# Patient Record
Sex: Male | Born: 1995 | Race: White | Hispanic: No | Marital: Single | Smoking: Former smoker
Health system: Southern US, Community
[De-identification: ages and names within clinical notes are randomized; demographics above are authoritative.]

## PROBLEM LIST (undated history)

## (undated) DIAGNOSIS — E559 Vitamin D deficiency, unspecified: Secondary | ICD-10-CM

## (undated) HISTORY — DX: Vitamin D deficiency, unspecified: E55.9

## (undated) HISTORY — PX: WISDOM TOOTH EXTRACTION: SHX21

---
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
## 2017-05-01 DIAGNOSIS — R109 Unspecified abdominal pain: Secondary | ICD-10-CM | POA: Insufficient documentation

## 2017-12-13 ENCOUNTER — Emergency Department
Admission: EM | Admit: 2017-12-13 | Discharge: 2017-12-13 | Disposition: A | Discharge status: Home / Self Care | Payer: Self-pay | Attending: Emergency Medicine | Admitting: Emergency Medicine

## 2017-12-13 ENCOUNTER — Other Ambulatory Visit: Payer: Self-pay

## 2017-12-13 ENCOUNTER — Encounter: Payer: Self-pay | Admitting: Emergency Medicine

## 2017-12-13 DIAGNOSIS — F17200 Nicotine dependence, unspecified, uncomplicated: Secondary | ICD-10-CM | POA: Insufficient documentation

## 2017-12-13 DIAGNOSIS — T50901A Poisoning by unspecified drugs, medicaments and biological substances, accidental (unintentional), initial encounter: Secondary | ICD-10-CM

## 2017-12-13 DIAGNOSIS — F1910 Other psychoactive substance abuse, uncomplicated: Secondary | ICD-10-CM

## 2017-12-13 DIAGNOSIS — F1990 Other psychoactive substance use, unspecified, uncomplicated: Secondary | ICD-10-CM | POA: Insufficient documentation

## 2017-12-13 LAB — URINE DRUG SCREEN, QUALITATIVE (ARMC ONLY)
Amphetamines, Ur Screen: NOT DETECTED
Benzodiazepine, Ur Scrn: NOT DETECTED
COCAINE METABOLITE, UR ~~LOC~~: POSITIVE — AB
Cannabinoid 50 Ng, Ur ~~LOC~~: POSITIVE — AB
MDMA (Ecstasy)Ur Screen: NOT DETECTED
METHADONE SCREEN, URINE: NOT DETECTED
Opiate, Ur Screen: NOT DETECTED
Phencyclidine (PCP) Ur S: NOT DETECTED
TRICYCLIC, UR SCREEN: NOT DETECTED

## 2017-12-13 NOTE — ED Provider Notes (Signed)
[facility] Emergency Department Provider Note  ____________________________________________   First MD Initiated Contact with Patient 12/13/17 1830     (approximate)  I have reviewed the triage vital signs and the nursing notes.   HISTORY  Chief Complaint Drug Overdose   Level 5 caveat:  history/ROS limited by acute intoxication   HPI HA PLACERES is a 22 y.o. male with no chronic medical issues other than polysubstance abuse.  He presents by EMS for evaluation of a bad reaction to hallucinogenics and other recreational drugs.  He was with a friend who is also a patient of mine.  They were out in the woods and dropping acid as well as apparently using other drugs which most likely includes cocaine and marijuana.  They both had a bad trip and both felt like they were going to die.  They were able to call the other patient's mother who then called this patient's father and other people who were able to get them out of the woods and get them to help.  The patient remains paranoid and anxious but denies SI and HI.  He states that he knows what he did was wrong.  He initially had some shortness of breath but now he is only anxious and paranoid and wants to go smoke.  The onset was acute and his symptoms are severe but he is getting better over time.  Nothing in particular makes the symptoms better or worse.  History reviewed. No pertinent past medical history.  There are no active problems to display for this patient.   History reviewed. No pertinent surgical history.  Prior to Admission medications   Not on File    Allergies Patient has no known allergies.  History reviewed. No pertinent family history.  Social History Social History   Tobacco Use  . Smoking status: Current Every Day Smoker  . Smokeless tobacco: Never Used  Substance Use Topics  . Alcohol use: Not on file  . Drug use: Yes    Types: Cocaine, Marijuana    Comment: acid     Review of Systems Level 5 caveat:  history/ROS limited by acute intoxication  ____________________________________________   PHYSICAL EXAM:  VITAL SIGNS: ED Triage Vitals  Enc Vitals Group     BP 12/13/17 1757 138/63     Pulse Rate 12/13/17 1757 (!) 103     Resp 12/13/17 1839 18     Temp 12/13/17 1757 98.4 F (36.9 C)     Temp Source 12/13/17 1757 Oral     SpO2 12/13/17 1757 98 %     Weight 12/13/17 1758 61.2 kg (135 lb)     Height 12/13/17 1758 1.727 m (5\' 8" )     Head Circumference --      Peak Flow --      Pain Score 12/13/17 1758 0     Pain Loc --      Pain Edu? --      Excl. in GC? --     Constitutional: Alert and oriented.  Paranoid and anxious but in no acute distress Eyes: Conjunctivae are normal.  Head: Atraumatic. Nose: No congestion/rhinnorhea. Mouth/Throat: Mucous membranes are moist. Neck: No stridor.  No meningeal signs.   Cardiovascular: Mild tachycardia, regular rhythm. Good peripheral circulation. Grossly normal heart sounds. Respiratory: Normal respiratory effort.  No retractions. Lungs CTAB. Gastrointestinal: Soft and nontender. No distention.  Musculoskeletal: No lower extremity tenderness nor edema. No gross deformities of extremities. Neurologic:  Normal speech and language.  No gross focal neurologic deficits are appreciated.  Skin:  Skin is warm, dry and intact. No rash noted. Psychiatric: Mood and affect are anxious and paranoid but he denies SI and HI.  He states he knows what he did was wrong.  He states he knows he should stop using drugs but cannot make himself say that he will stop using.  ____________________________________________   LABS (all labs ordered are listed, but only abnormal results are displayed)  Labs Reviewed  URINE DRUG SCREEN, QUALITATIVE ([facility] ONLY) - Abnormal; Notable for the following components:      Result Value   Cocaine Metabolite,Ur [place] POSITIVE (*)    Cannabinoid 50 Ng, Ur [place] POSITIVE (*)    Barbiturates,  Ur Screen   (*)    Value: Result not available. Reagent lot number recalled by manufacturer.   All other components within normal limits   ____________________________________________  EKG  None - EKG not ordered by ED physician ____________________________________________  RADIOLOGY None  ED MD interpretation: No indication for imaging  Official radiology report(s): No results found.  ____________________________________________   PROCEDURES  Critical Care performed: No   Procedure(s) performed:   Procedures   ____________________________________________   INITIAL IMPRESSION / ASSESSMENT AND PLAN / ED COURSE  As part of my medical decision making, I reviewed the following data within the electronic MEDICAL RECORD NUMBER History obtained from family and Labs reviewed     Differential diagnosis includes, but is not limited to, accidental or intentional drug overdose, polysubstance abuse, suicide attempt, substance-induced mood disorder.  The patient denies SI/HI.  He has been observed for several hours in the emergency department and is hemodynamically stable.  Urine drug screen is positive for multiple substances including probably some (like acid) that cannot be detected on the UDS.  Regardless, he does not meet any involuntary commitment criteria and does not need additional medical treatment or observation or psychiatric treatment or observation.  His father is at bedside and I went over some recommendations and return precautions with him.  He is comfortable taking his son at home and watching him and making sure he stays safe at home.  Insert gave precautions     ____________________________________________  FINAL CLINICAL IMPRESSION(S) / ED DIAGNOSES  Final diagnoses:  Recreational drug use  Polysubstance abuse (HCC)  Accidental drug overdose, initial encounter     MEDICATIONS GIVEN DURING THIS VISIT:  Medications - No data to display   ED Discharge  Orders    None       Note:  This document was prepared using Dragon voice recognition software and may include unintentional dictation errors.    Loleta RoseForbach, Ifeoluwa Beller, MD 12/13/17 2138

## 2017-12-13 NOTE — ED Notes (Signed)
Father at bedside.

## 2017-12-13 NOTE — ED Notes (Signed)
Patient ambulated with steady gait in hallway to bathroom and back. NAD.

## 2017-12-13 NOTE — Discharge Instructions (Addendum)
You have been seen in the Emergency Department (ED) today for substance abuse.  You have been evaluated by the ED physician and are being discharged with outpatient resources and follow up recommendations.  Please return to the ED immediately if you have ANY thoughts of hurting yourself or anyone else, so that we may help you.  Please avoid alcohol and drug use.  Follow up with your doctor and/or therapist as soon as possible regarding today's ED  visit.   Please follow up any other recommendations and clinic appointments provided by the psychiatry team that saw you in the Emergency Department.

## 2017-12-13 NOTE — ED Triage Notes (Signed)
Pt arrives via ems, states that he is having a hard time getting thoughts together after dropping 5 hits of acid around 1500 today. Pt states that he is feeling better knowing that he is here and that we can help if he needs it

## 2017-12-13 NOTE — ED Notes (Signed)
Patient is resting in hallway bed. NAD.

## 2023-07-17 ENCOUNTER — Ambulatory Visit: Payer: BC Managed Care – PPO | Admitting: Nurse Practitioner

## 2023-08-14 ENCOUNTER — Encounter: Payer: Self-pay | Admitting: Nurse Practitioner

## 2023-08-14 ENCOUNTER — Ambulatory Visit (INDEPENDENT_AMBULATORY_CARE_PROVIDER_SITE_OTHER): Payer: BC Managed Care – PPO | Admitting: Nurse Practitioner

## 2023-08-14 VITALS — BP 106/62 | HR 68 | Temp 98.6°F | Ht 68.0 in | Wt 141.2 lb

## 2023-08-14 DIAGNOSIS — Z0000 Encounter for general adult medical examination without abnormal findings: Secondary | ICD-10-CM | POA: Diagnosis not present

## 2023-08-14 DIAGNOSIS — Z0001 Encounter for general adult medical examination with abnormal findings: Secondary | ICD-10-CM | POA: Insufficient documentation

## 2023-08-14 DIAGNOSIS — Z720 Tobacco use: Secondary | ICD-10-CM | POA: Diagnosis not present

## 2023-08-14 DIAGNOSIS — Z1159 Encounter for screening for other viral diseases: Secondary | ICD-10-CM | POA: Diagnosis not present

## 2023-08-14 DIAGNOSIS — E559 Vitamin D deficiency, unspecified: Secondary | ICD-10-CM | POA: Diagnosis not present

## 2023-08-14 LAB — CBC
HCT: 45.8 % (ref 39.0–52.0)
Hemoglobin: 15.4 g/dL (ref 13.0–17.0)
MCHC: 33.6 g/dL (ref 30.0–36.0)
MCV: 90.7 fl (ref 78.0–100.0)
Platelets: 260 10*3/uL (ref 150.0–400.0)
RBC: 5.05 Mil/uL (ref 4.22–5.81)
RDW: 13.4 % (ref 11.5–15.5)
WBC: 4.5 10*3/uL (ref 4.0–10.5)

## 2023-08-14 LAB — VITAMIN D 25 HYDROXY (VIT D DEFICIENCY, FRACTURES): VITD: 29.72 ng/mL — ABNORMAL LOW (ref 30.00–100.00)

## 2023-08-14 LAB — COMPREHENSIVE METABOLIC PANEL
ALT: 12 U/L (ref 0–53)
AST: 15 U/L (ref 0–37)
Albumin: 5.1 g/dL (ref 3.5–5.2)
Alkaline Phosphatase: 58 U/L (ref 39–117)
BUN: 16 mg/dL (ref 6–23)
CO2: 27 meq/L (ref 19–32)
Calcium: 9.9 mg/dL (ref 8.4–10.5)
Chloride: 104 meq/L (ref 96–112)
Creatinine, Ser: 1.02 mg/dL (ref 0.40–1.50)
GFR: 101.22 mL/min (ref >60.00)
Glucose, Bld: 101 mg/dL — ABNORMAL HIGH (ref 70–99)
Potassium: 5.1 meq/L (ref 3.5–5.1)
Sodium: 138 meq/L (ref 135–145)
Total Bilirubin: 0.6 mg/dL (ref 0.2–1.2)
Total Protein: 7.6 g/dL (ref 6.0–8.3)

## 2023-08-14 NOTE — Progress Notes (Signed)
 Complete physical exam  Patient: Edward Moss   DOB: Nov 20, 1995   27 y.o. Male  MRN: [id number]  Subjective:    Chief Complaint  Patient presents with   Annual Exam    Edward Moss is a 28 y.o. male who presents today for a complete physical exam. He reports consuming a general diet.  Exercise weight training, 3x/week for 30 minutes per session.  He generally feels well. He reports sleeping well. He does not have additional problems to discuss today.    Patient has as a new patient today for annual physical exam.  He has no acute concerns.  He reports he has not seen healthcare provider routinely since seeing his pediatrician but is encouraged by his significant other to establish care with primary care at this time.  He does report that he has had vitamin D deficiency in the past and currently takes a vitamin D supplement.  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  Most recent fall risk assessment:    08/14/2023    9:59 AM  Fall Risk   Falls in the past year? 0  Number falls in past yr: 0  Injury with Fall? 0  Risk for fall due to : No Fall Risks  Follow up Falls evaluation completed     Most recent depression screenings:    08/14/2023    9:59 AM  PHQ 2/9 Scores  PHQ - 2 Score 0    Vision:Not within last year  and Dental: No current dental problems and Receives regular dental care  Past Medical History:  Diagnosis Date   Vitamin D deficiency    Past Surgical History:  Procedure Laterality Date   WISDOM TOOTH EXTRACTION Bilateral    Social History   Socioeconomic History   Marital status: Single    Spouse name: Not on file   Number of children: Not on file   Years of education: Not on file   Highest education level: Not on file  Occupational History   Not on file  Tobacco Use   Smoking status: Former    Types: Cigarettes    Start date: 2021   Smokeless tobacco: Never  Substance  and Sexual Activity   Alcohol use: Yes    Comment: Social   Drug use: Not Currently    Types: Marijuana, Cocaine    Comment: acid   Sexual activity: Yes    Partners: Female    Birth control/protection: Condom  Other Topics Concern   Not on file  Social History Narrative   Not on file   Social Drivers of Health   Financial Resource Strain: Not on file  Food Insecurity: Not on file  Transportation Needs: Not on file  Physical Activity: Not on file  Stress: Not on file  Social Connections: Not on file  Intimate Partner Violence: Not on file   History reviewed. No pertinent family history. No Known Allergies    Patient Care Team: Elenore Paddy, NP as PCP - General (Nurse Practitioner)   Outpatient Medications Prior to Visit  Medication Sig   Cholecalciferol (VITAMIN D3 PO) Take by mouth.   Cyanocobalamin (VITAMIN B-12 PO) Take by mouth.   Multiple Vitamins-Minerals (ZINC PO) Take by mouth.   No facility-administered medications prior to visit.    Review of Systems  Constitutional:  Negative for fever, malaise/fatigue and weight loss.  HENT:  Negative for hearing loss and tinnitus.  Eyes:  Negative for blurred vision and double vision.  Respiratory:  Negative for cough, shortness of breath and wheezing.   Cardiovascular:  Negative for chest pain and palpitations.  Gastrointestinal:  Negative for abdominal pain and blood in stool.  Genitourinary:  Negative for dysuria and hematuria.  Skin:  Negative for itching and rash.  Neurological:  Negative for seizures and loss of consciousness.  Psychiatric/Behavioral:  Negative for depression and suicidal ideas. The patient is not nervous/anxious and does not have insomnia.           Objective:     BP 106/62   Pulse 68   Temp 98.6 F (37 C) (Temporal)   Ht 5\' 8"  (1.727 m)   Wt 141 lb 4 oz (64.1 kg)   SpO2 98%   BMI 21.48 kg/m  BP Readings from Last 3 Encounters:  08/14/23 106/62  12/13/17 137/74   Wt  Readings from Last 3 Encounters:  08/14/23 141 lb 4 oz (64.1 kg)  12/13/17 135 lb (61.2 kg)      08/14/2023    9:59 AM  PHQ9 SCORE ONLY  PHQ-9 Total Score 0     Physical Exam Vitals reviewed.  Constitutional:      General: He is not in acute distress.    Appearance: Normal appearance. He is not ill-appearing.  HENT:     Head: Normocephalic and atraumatic.     Right Ear: Tympanic membrane, ear canal and external ear normal.     Left Ear: Tympanic membrane, ear canal and external ear normal.  Eyes:     General: No scleral icterus.    Extraocular Movements: Extraocular movements intact.     Conjunctiva/sclera: Conjunctivae normal.     Pupils: Pupils are equal, round, and reactive to light.  Neck:     Vascular: No carotid bruit.  Cardiovascular:     Rate and Rhythm: Normal rate and regular rhythm.     Pulses: Normal pulses.     Heart sounds: Normal heart sounds.  Pulmonary:     Effort: Pulmonary effort is normal.     Breath sounds: Normal breath sounds.  Abdominal:     General: Bowel sounds are normal. There is no distension.     Palpations: There is no mass.     Tenderness: There is no abdominal tenderness.     Hernia: No hernia is present.  Musculoskeletal:        General: No swelling or tenderness.     Cervical back: Normal range of motion and neck supple. No rigidity.  Lymphadenopathy:     Cervical: No cervical adenopathy.  Skin:    General: Skin is warm and dry.  Neurological:     General: No focal deficit present.     Mental Status: He is alert and oriented to person, place, and time.     Cranial Nerves: No cranial nerve deficit.     Sensory: No sensory deficit.     Motor: No weakness.     Gait: Gait normal.  Psychiatric:        Mood and Affect: Mood normal.        Behavior: Behavior normal.        Judgment: Judgment normal.      No results found for any visits on 08/14/23.     Assessment & Plan:    Routine Health Maintenance and Physical  Exam  Immunization History  Administered Date(s) Administered   Tdap 06/03/2021    Health Maintenance  Topic Date Due  HPV VACCINES (1 - Male 3-dose series) Never done   HIV Screening  Never done   Hepatitis C Screening  Never done   INFLUENZA VACCINE  09/01/2023 (Originally 01/02/2023)   DTaP/Tdap/Td (2 - Td or Tdap) 06/04/2031   COVID-19 Vaccine  Discontinued    Discussed health benefits of physical activity, and encouraged him to engage in regular exercise appropriate for his age and condition.  Problem List Items Addressed This Visit       Other   Vitamin D deficiency - Primary   Chronic Patient takes daily supplement, he is not sure of the exact dose. Will check serum level, further recommendations may be made based on the results.      Relevant Orders   Comprehensive metabolic panel   CBC   Hepatitis C antibody   VITAMIN D 25 Hydroxy (Vit-D Deficiency, Fractures)   Encounter for hepatitis C screening test for low risk patient   Labs ordered, further recommendations may be made based upon his results       Relevant Orders   Comprehensive metabolic panel   CBC   Hepatitis C antibody   VITAMIN D 25 Hydroxy (Vit-D Deficiency, Fractures)   Encounter for general adult medical examination with abnormal findings   Discussed healthy lifestyle and screening recommendation as well as immunizations.  Handout provided.  Patient has declined STD screening today.  He would like hep C screening completed we will get hep C antibody today.  Reports last Tdap was administered 1 to 2 years ago.  Has declined flu vaccine at this time.      Relevant Orders   Comprehensive metabolic panel   CBC   Hepatitis C antibody   VITAMIN D 25 Hydroxy (Vit-D Deficiency, Fractures)   Vapes nicotine containing substance   Patient denies use of cigarettes but does report that he vapes.  He was encouraged to consider discontinuing vaping.      Return in about 1 year (around 08/13/2024) for CPE  with Sharmeka Palmisano.     Elenore Paddy, NP

## 2023-08-14 NOTE — Assessment & Plan Note (Signed)
 Labs ordered, further recommendations may be made based upon his results.

## 2023-08-14 NOTE — Assessment & Plan Note (Signed)
 Chronic Patient takes daily supplement, he is not sure of the exact dose. Will check serum level, further recommendations may be made based on the results.

## 2023-08-14 NOTE — Assessment & Plan Note (Signed)
 Patient denies use of cigarettes but does report that he vapes.  He was encouraged to consider discontinuing vaping.

## 2023-08-14 NOTE — Assessment & Plan Note (Signed)
 Discussed healthy lifestyle and screening recommendation as well as immunizations.  Handout provided.  Patient has declined STD screening today.  He would like hep C screening completed we will get hep C antibody today.  Reports last Tdap was administered 1 to 2 years ago.  Has declined flu vaccine at this time.

## 2023-08-15 ENCOUNTER — Encounter: Payer: Self-pay | Admitting: Nurse Practitioner

## 2023-08-15 LAB — HEPATITIS C ANTIBODY: Hepatitis C Ab: NONREACTIVE

## 2023-12-29 ENCOUNTER — Encounter: Payer: Self-pay | Admitting: Internal Medicine

## 2023-12-29 ENCOUNTER — Ambulatory Visit: Admitting: Internal Medicine

## 2023-12-29 VITALS — BP 100/60 | HR 71 | Temp 98.3°F | Ht 68.0 in | Wt 138.0 lb

## 2023-12-29 DIAGNOSIS — R361 Hematospermia: Secondary | ICD-10-CM | POA: Insufficient documentation

## 2023-12-29 DIAGNOSIS — D229 Melanocytic nevi, unspecified: Secondary | ICD-10-CM | POA: Insufficient documentation

## 2023-12-29 DIAGNOSIS — Z1283 Encounter for screening for malignant neoplasm of skin: Secondary | ICD-10-CM

## 2023-12-29 NOTE — Assessment & Plan Note (Signed)
 Acute Transient 4-day history of blood in semen Mild, resolved No associated symptoms and currently denies any concerning urinary symptoms Given the transient nature and no concerning urinary/penile/scrotum symptoms this is likely benign in nature and no further evaluation is necessary Possibly related to trauma from the activities that he did Stressed that if this recurs then he needs to see urologist-he will update me if he has another episode

## 2023-12-29 NOTE — Assessment & Plan Note (Signed)
 Has 2 mol on his back that his girlfriend was concerned about One of them appears to have bluish discoloration Occasionally itch and have scabbed over No pain On exam they appear benign Denies frequent Edward Moss as a child Would benefit from skin cancer screening and evaluation of moles Referral ordered for dermatology

## 2023-12-29 NOTE — Progress Notes (Signed)
    Subjective:    Patient ID: Edward Moss, male    DOB: 09/10/1995, 28 y.o.   MRN: [id number]      HPI Edward Moss is here for  Chief Complaint  Patient presents with   Nevus     Moles on back  - itch at times.   Have scabbed over at times.  His girlfriend was concerned that one of them had a little bluish color in it and thought it should be evaluated.   About 10 days ago he experienced blood in his semen.  This lasted for 4 days.  It was very mild.  It stopped 1 week ago.  He has not seen any since.  He denies any dysuria, difficulty urinating, increase in frequency or urgency.  Does not have any discharge.  He denies any pain.  This happened while on vacation and after scuba diving and riding the Jet[employer]ki.  He is monogamous with his girlfriend.  She does not have any concerning symptoms.   Medications and allergies reviewed with patient and updated if appropriate.  Current Outpatient Medications on File Prior to Visit  Medication Sig Dispense Refill   Cholecalciferol (VITAMIN D3 PO) Take by mouth.     Cyanocobalamin (VITAMIN B-12 PO) Take by mouth.     Multiple Vitamins-Minerals (ZINC PO) Take by mouth.     No current facility-administered medications on file prior to visit.    Review of Systems  Constitutional:  Negative for chills and fever.  Gastrointestinal:  Negative for abdominal pain.  Genitourinary:  Negative for difficulty urinating, dysuria, frequency, hematuria, penile pain, scrotal swelling and urgency.       Objective:   Vitals:   12/29/23 1011  BP: 100/60  Pulse: 71  Temp: 98.3 F (36.8 C)  SpO2: 98%   BP Readings from Last 3 Encounters:  12/29/23 100/60  08/14/23 106/62  12/13/17 137/74   Wt Readings from Last 3 Encounters:  12/29/23 138 lb (62.6 kg)  08/14/23 141 lb 4 oz (64.1 kg)  12/13/17 135 lb (61.2 kg)   Body mass index is 20.98 kg/m.    Physical Exam Constitutional:      General: He is not in acute distress.    Appearance:  Normal appearance. He is not ill-appearing.  HENT:     Head: Atraumatic.  Genitourinary:    Comments: Deferred Skin:    General: Skin is warm and dry.     Findings: No rash.     Comments: Moles of concern are on the mid right upper back-benign-appearing  Neurological:     Mental Status: He is alert.            Assessment & Plan:    See Problem List for Assessment and Plan of chronic medical problems.

## 2023-12-29 NOTE — Patient Instructions (Signed)
     A referral was ordered Dermatology and someone will call you to schedule an appointment.
